# Patient Record
Sex: Female | Born: 1955
Health system: Southern US, Community
[De-identification: ages and names within clinical notes are randomized; demographics above are authoritative.]

## PROBLEM LIST (undated history)

## (undated) HISTORY — PX: AUGMENTATION MAMMAPLASTY: SUR837

---

## 2007-04-17 ENCOUNTER — Encounter: Admission: RE | Admit: 2007-04-17 | Discharge: 2007-04-17 | Payer: Self-pay | Admitting: Obstetrics and Gynecology

## 2007-06-15 ENCOUNTER — Emergency Department (HOSPITAL_COMMUNITY): Admission: EM | Admit: 2007-06-15 | Discharge: 2007-06-15 | Payer: Self-pay | Admitting: Emergency Medicine

## 2008-01-05 ENCOUNTER — Emergency Department (HOSPITAL_BASED_OUTPATIENT_CLINIC_OR_DEPARTMENT_OTHER): Admission: EM | Admit: 2008-01-05 | Discharge: 2008-01-06 | Payer: Self-pay | Admitting: Emergency Medicine

## 2008-01-08 ENCOUNTER — Ambulatory Visit: Payer: Self-pay | Admitting: Internal Medicine

## 2008-01-22 ENCOUNTER — Ambulatory Visit: Payer: Self-pay | Admitting: Internal Medicine

## 2008-04-17 ENCOUNTER — Encounter: Admission: RE | Admit: 2008-04-17 | Discharge: 2008-04-17 | Payer: Self-pay | Admitting: Obstetrics and Gynecology

## 2008-04-30 ENCOUNTER — Encounter: Admission: RE | Admit: 2008-04-30 | Discharge: 2008-04-30 | Payer: Self-pay | Admitting: Obstetrics and Gynecology

## 2009-04-20 ENCOUNTER — Encounter: Admission: RE | Admit: 2009-04-20 | Discharge: 2009-04-20 | Payer: Self-pay | Admitting: Obstetrics and Gynecology

## 2009-05-03 ENCOUNTER — Encounter: Admission: RE | Admit: 2009-05-03 | Discharge: 2009-05-03 | Payer: Self-pay | Admitting: Obstetrics and Gynecology

## 2010-06-18 ENCOUNTER — Other Ambulatory Visit: Payer: Self-pay | Admitting: Obstetrics and Gynecology

## 2010-06-18 DIAGNOSIS — Z1239 Encounter for other screening for malignant neoplasm of breast: Secondary | ICD-10-CM

## 2010-06-19 ENCOUNTER — Encounter: Payer: Self-pay | Admitting: Obstetrics and Gynecology

## 2010-06-20 ENCOUNTER — Encounter: Payer: Self-pay | Admitting: Ophthalmology

## 2010-06-28 NOTE — Miscellaneous (Signed)
Summary: GI PV  Clinical Lists Changes  Medications: Added new medication of MOVIPREP 100 GM  SOLR (PEG-KCL-NACL-NASULF-NA ASC-C) As per prep instructions. - Signed Rx of MOVIPREP 100 GM  SOLR (PEG-KCL-NACL-NASULF-NA ASC-C) As per prep instructions.;  #1 x 0;  Signed;  Entered by: Barton Fanny RN;  Authorized by: Hilarie Fredrickson MD;  Method used: Electronic Observations: Added new observation of NKA: T (01/08/2008 9:27)    Prescriptions: MOVIPREP 100 GM  SOLR (PEG-KCL-NACL-NASULF-NA ASC-C) As per prep instructions.  #1 x 0   Entered by:   Barton Fanny RN   Authorized by:   Hilarie Fredrickson MD   Signed by:   Barton Fanny RN on 01/08/2008   Method used:   Electronically sent to ...       [facility] # [id number]*       [address]       [place], [place]  [address]       Ph: [phone]       Fax: [phone]   RxID:   [id number]

## 2010-06-28 NOTE — Procedures (Signed)
Summary: Colonoscopy   Colonoscopy  Procedure date:  01/22/2008  Findings:      Location:  [facility].    Procedures Next Due Date:    Colonoscopy: 12/2017  Patient Name: Erica, Chambers MRN:  Procedure Procedures: Colonoscopy CPT: 60454.  Personnel: Endoscopist: Wilhemina Bonito. Marina Goodell, MD.  Referred By: Sandra Cockayne, MD.  Exam Location: Exam performed in Outpatient Clinic. Outpatient  Patient Consent: Procedure, Alternatives, Risks and Benefits discussed, consent obtained, from patient. Consent was obtained by the RN.  Indications  Average Risk Screening Routine.  History  Current Medications: Patient is not currently taking Coumadin.  Pre-Exam Physical: Performed Jan 22, 2008. Cardio-pulmonary exam, Rectal exam, HEENT exam , Abdominal exam, Mental status exam WNL.  Comments: Pt. history reviewed/updated, physical exam performed prior to initiation of sedation?yes Exam Exam: Extent of exam reached: Cecum, extent intended: Cecum.  The cecum was identified by appendiceal orifice and IC valve. Patient position: on left side. Time to Cecum: 00:05:28. Time for Withdrawl: 00:12:24. ASA Classification: I. Tolerance: excellent.  Monitoring: Pulse and BP monitoring, Oximetry used. Supplemental O2 given.  Colon Prep Used Movi prep for colon prep. Prep results: excellent.  Sedation Meds: Patient assessed and found to be appropriate for moderate (conscious) sedation. Fentanyl 75 mcg. given IV. Versed 8 mg. given IV. Benadryl 50mg  given IV.  Findings NORMAL EXAM: Cecum to Rectum.   Assessment Normal examination.  Comments: NO POLYPS SEEN Events  Unplanned Interventions: No intervention was required.  Unplanned Events: There were no complications. Plans Disposition: After procedure patient sent to recovery. After recovery patient sent home.  Scheduling/Referral: Colonoscopy, to Wilhemina Bonito. Marina Goodell, MD, IN 10 YEARS FOR ROUTINE SCREENING,     cc.    Wynelle Beckmann Shaw,MD   This report was created from the original endoscopy report, which was reviewed and signed by the above listed endoscopist.

## 2010-07-05 ENCOUNTER — Ambulatory Visit
Admission: RE | Admit: 2010-07-05 | Discharge: 2010-07-05 | Disposition: A | Discharge status: Home / Self Care | Payer: BC Managed Care – PPO | Source: Ambulatory Visit | Attending: Obstetrics and Gynecology | Admitting: Obstetrics and Gynecology

## 2010-07-05 DIAGNOSIS — Z1239 Encounter for other screening for malignant neoplasm of breast: Secondary | ICD-10-CM

## 2010-07-11 ENCOUNTER — Other Ambulatory Visit: Payer: Self-pay | Admitting: Obstetrics and Gynecology

## 2010-07-11 DIAGNOSIS — R928 Other abnormal and inconclusive findings on diagnostic imaging of breast: Secondary | ICD-10-CM

## 2010-07-19 ENCOUNTER — Ambulatory Visit: Payer: BC Managed Care – PPO

## 2010-07-19 ENCOUNTER — Ambulatory Visit
Admission: RE | Admit: 2010-07-19 | Discharge: 2010-07-19 | Disposition: A | Discharge status: Home / Self Care | Payer: BC Managed Care – PPO | Source: Ambulatory Visit | Attending: Obstetrics and Gynecology | Admitting: Obstetrics and Gynecology

## 2010-07-19 DIAGNOSIS — R928 Other abnormal and inconclusive findings on diagnostic imaging of breast: Secondary | ICD-10-CM

## 2011-06-26 ENCOUNTER — Other Ambulatory Visit: Payer: Self-pay | Admitting: Obstetrics and Gynecology

## 2011-06-26 DIAGNOSIS — Z1231 Encounter for screening mammogram for malignant neoplasm of breast: Secondary | ICD-10-CM

## 2011-07-20 ENCOUNTER — Other Ambulatory Visit: Payer: Self-pay | Admitting: Obstetrics and Gynecology

## 2011-07-20 ENCOUNTER — Ambulatory Visit
Admission: RE | Admit: 2011-07-20 | Discharge: 2011-07-20 | Disposition: A | Discharge status: Home / Self Care | Payer: 59 | Source: Ambulatory Visit | Attending: Obstetrics and Gynecology | Admitting: Obstetrics and Gynecology

## 2011-07-20 DIAGNOSIS — Z1231 Encounter for screening mammogram for malignant neoplasm of breast: Secondary | ICD-10-CM

## 2012-07-31 ENCOUNTER — Other Ambulatory Visit: Payer: Self-pay

## 2012-07-31 DIAGNOSIS — Z1231 Encounter for screening mammogram for malignant neoplasm of breast: Secondary | ICD-10-CM

## 2012-08-22 ENCOUNTER — Ambulatory Visit
Admission: RE | Admit: 2012-08-22 | Discharge: 2012-08-22 | Disposition: A | Discharge status: Home / Self Care | Payer: BC Managed Care – PPO | Source: Ambulatory Visit

## 2012-08-22 DIAGNOSIS — Z1231 Encounter for screening mammogram for malignant neoplasm of breast: Secondary | ICD-10-CM

## 2013-08-06 ENCOUNTER — Other Ambulatory Visit: Payer: Self-pay

## 2013-08-06 DIAGNOSIS — Z1231 Encounter for screening mammogram for malignant neoplasm of breast: Secondary | ICD-10-CM

## 2013-09-02 ENCOUNTER — Ambulatory Visit
Admission: RE | Admit: 2013-09-02 | Discharge: 2013-09-02 | Disposition: A | Discharge status: Home / Self Care | Payer: BC Managed Care – PPO | Source: Ambulatory Visit

## 2013-09-02 DIAGNOSIS — Z1231 Encounter for screening mammogram for malignant neoplasm of breast: Secondary | ICD-10-CM

## 2013-10-28 DIAGNOSIS — H16211 Exposure keratoconjunctivitis, right eye: Secondary | ICD-10-CM | POA: Insufficient documentation

## 2014-10-20 ENCOUNTER — Encounter: Payer: Self-pay | Admitting: Internal Medicine

## 2015-01-07 ENCOUNTER — Other Ambulatory Visit: Payer: Self-pay

## 2015-01-07 DIAGNOSIS — Z1231 Encounter for screening mammogram for malignant neoplasm of breast: Secondary | ICD-10-CM

## 2015-01-22 ENCOUNTER — Ambulatory Visit
Admission: RE | Admit: 2015-01-22 | Discharge: 2015-01-22 | Disposition: A | Discharge status: Home / Self Care | Payer: BLUE CROSS/BLUE SHIELD | Source: Ambulatory Visit

## 2015-01-22 DIAGNOSIS — Z1231 Encounter for screening mammogram for malignant neoplasm of breast: Secondary | ICD-10-CM

## 2015-04-30 ENCOUNTER — Other Ambulatory Visit: Payer: Self-pay | Admitting: Otolaryngology

## 2015-04-30 DIAGNOSIS — H9191 Unspecified hearing loss, right ear: Secondary | ICD-10-CM

## 2015-05-09 ENCOUNTER — Other Ambulatory Visit: Payer: BLUE CROSS/BLUE SHIELD

## 2015-05-18 ENCOUNTER — Other Ambulatory Visit: Payer: Self-pay | Admitting: Obstetrics and Gynecology

## 2015-05-18 DIAGNOSIS — N3289 Other specified disorders of bladder: Secondary | ICD-10-CM

## 2015-05-26 ENCOUNTER — Other Ambulatory Visit: Payer: BLUE CROSS/BLUE SHIELD

## 2015-06-03 ENCOUNTER — Ambulatory Visit
Admission: RE | Admit: 2015-06-03 | Discharge: 2015-06-03 | Disposition: A | Discharge status: Home / Self Care | Payer: BLUE CROSS/BLUE SHIELD | Source: Ambulatory Visit | Attending: Obstetrics and Gynecology | Admitting: Obstetrics and Gynecology

## 2015-06-03 DIAGNOSIS — N3289 Other specified disorders of bladder: Secondary | ICD-10-CM

## 2015-06-03 MED ORDER — IOPAMIDOL (ISOVUE-300) INJECTION 61%
100.0000 mL | Freq: Once | INTRAVENOUS | Status: AC | PRN
  Administered 2015-06-03: 100 mL via INTRAVENOUS

## 2016-01-06 ENCOUNTER — Other Ambulatory Visit: Payer: Self-pay | Admitting: Obstetrics and Gynecology

## 2016-01-06 DIAGNOSIS — Z9882 Breast implant status: Secondary | ICD-10-CM

## 2016-01-06 DIAGNOSIS — Z1231 Encounter for screening mammogram for malignant neoplasm of breast: Secondary | ICD-10-CM

## 2016-01-27 ENCOUNTER — Ambulatory Visit
Admission: RE | Admit: 2016-01-27 | Discharge: 2016-01-27 | Disposition: A | Discharge status: Home / Self Care | Payer: BLUE CROSS/BLUE SHIELD | Source: Ambulatory Visit | Attending: Obstetrics and Gynecology | Admitting: Obstetrics and Gynecology

## 2016-01-27 DIAGNOSIS — Z9882 Breast implant status: Secondary | ICD-10-CM

## 2016-01-27 DIAGNOSIS — Z1231 Encounter for screening mammogram for malignant neoplasm of breast: Secondary | ICD-10-CM

## 2016-10-25 IMAGING — CT CT ABD-PELV W/ CM
2 of 5 series · 10 of 36 positions shown, 17 images · IV contrast (READICAT/WATER & [ID] ISOVUE 300)
Comparison: None.

CLINICAL DATA: Possible bladder mass on physical exam. Prior
rectocele repair in 6552, history of partial hysterectomy in 0551.

EXAM:
CT ABDOMEN AND PELVIS WITH CONTRAST
TECHNIQUE: Multidetector CT imaging of the abdomen and pelvis was performed
using the standard protocol following bolus administration of
intravenous contrast.
CONTRAST:  100mL RTFO2K-0RR IOPAMIDOL (RTFO2K-0RR) INJECTION 61%

[Series 3: abd/pelvis with · axial · 0.59mm/px · z∈[-348,-18]mm · 9 of 84 slices shown, 15 images]
[im 9/84  soft-tissue]
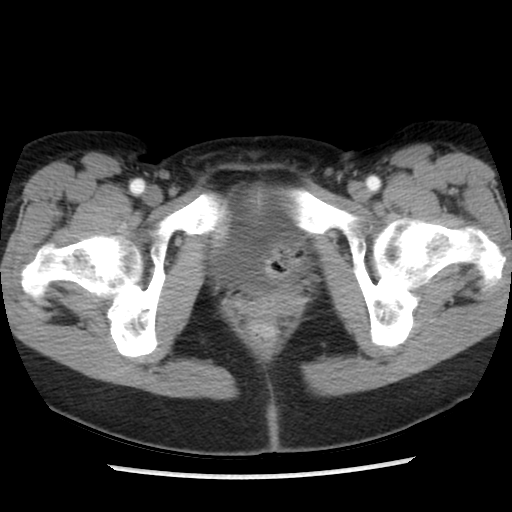
[im 9/84  bone]
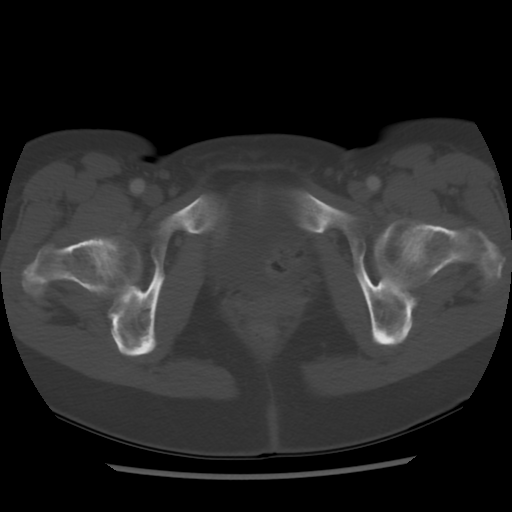
[im 17/84  soft-tissue]
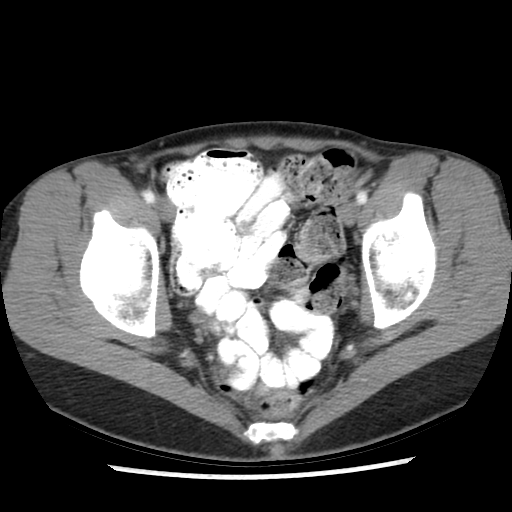
[im 25/84  soft-tissue]
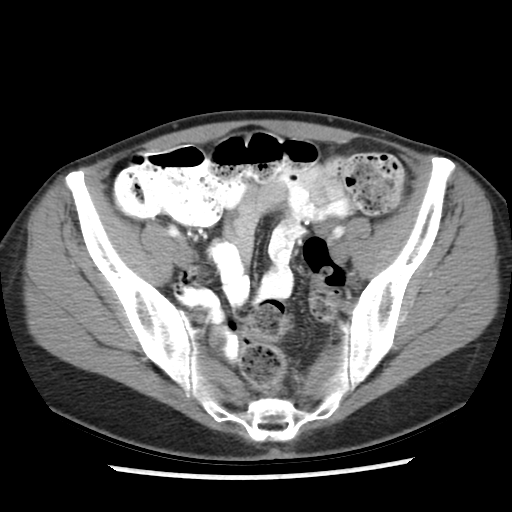
[im 34/84  soft-tissue]
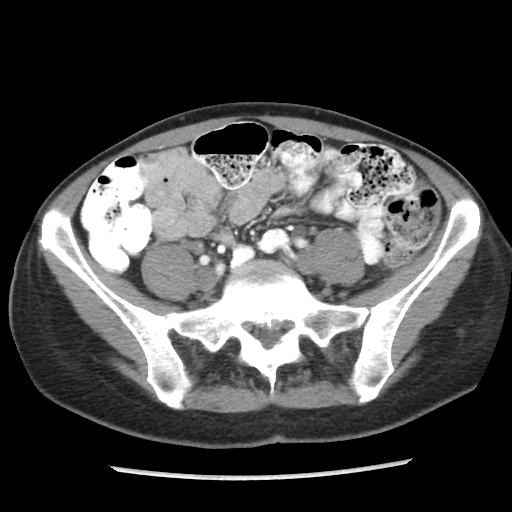
[im 42/84  soft-tissue]
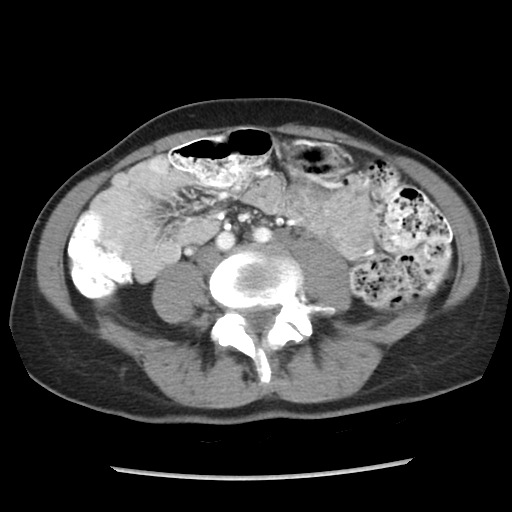
[im 50/84  soft-tissue]
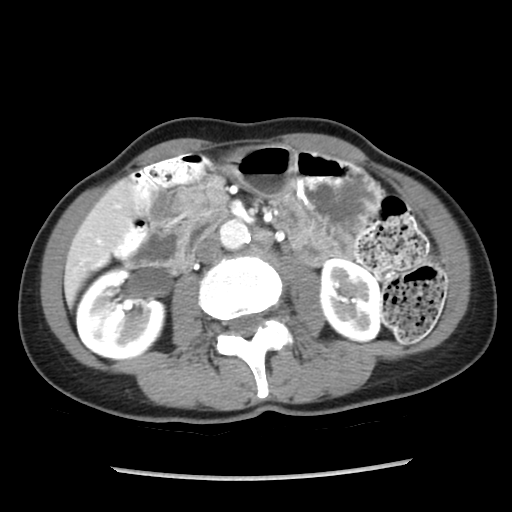
[im 50/84  lung]
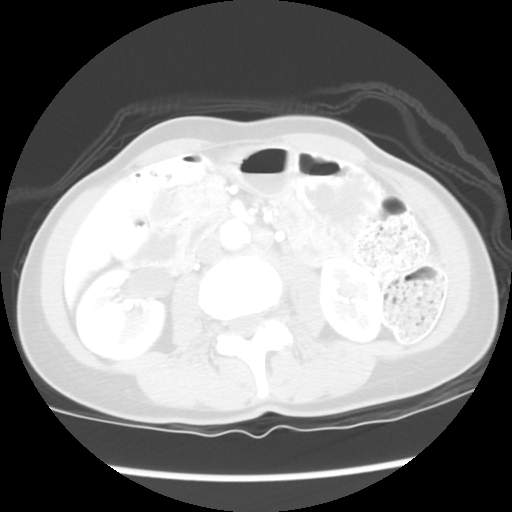
[im 59/84  soft-tissue]
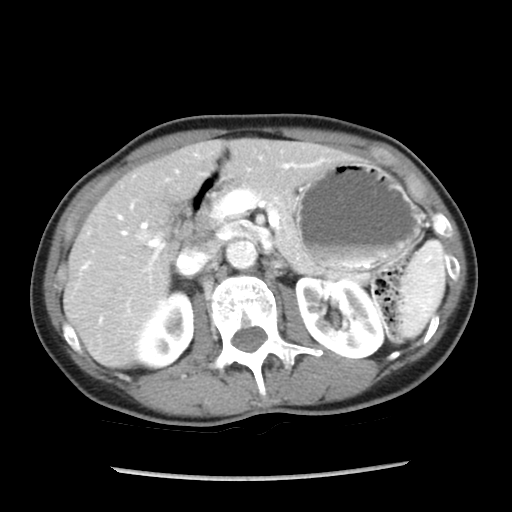
[im 59/84  lung]
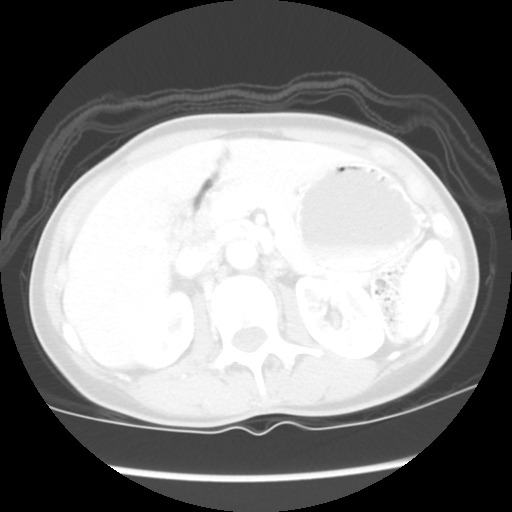
[im 67/84  soft-tissue]
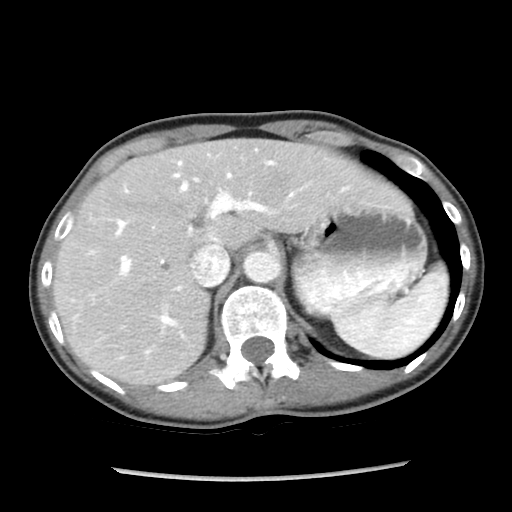
[im 67/84  lung]
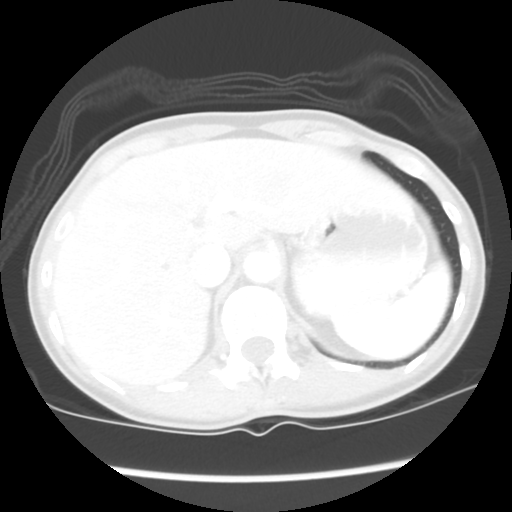
[im 75/84  soft-tissue]
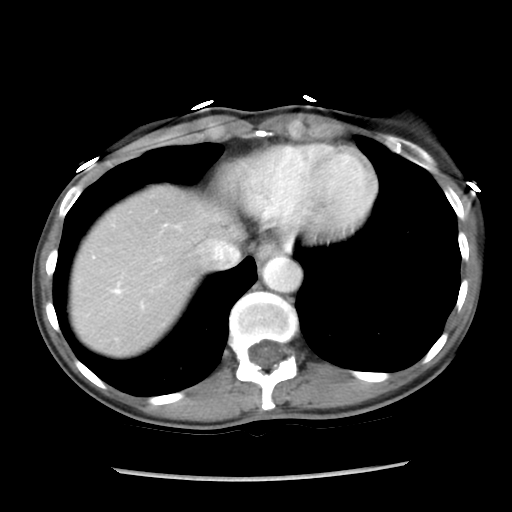
[im 75/84  lung]
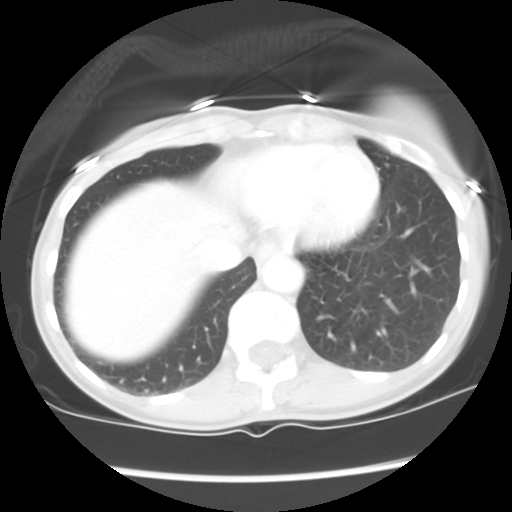
[im 75/84  bone]
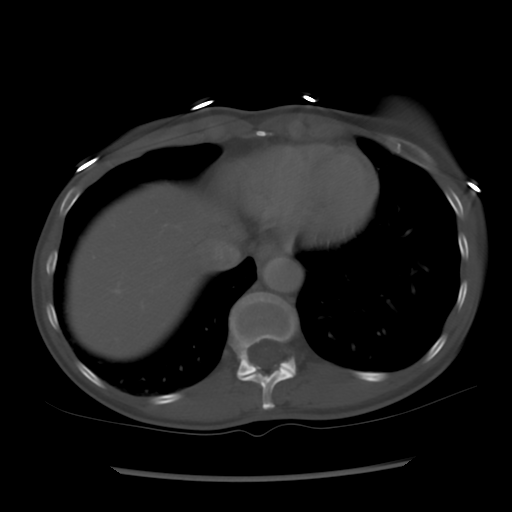

[Series 601: coronal body · coronal · 0.90mm/px · 1 of 103 slices shown, 2 images]
[im 35/103  soft-tissue]
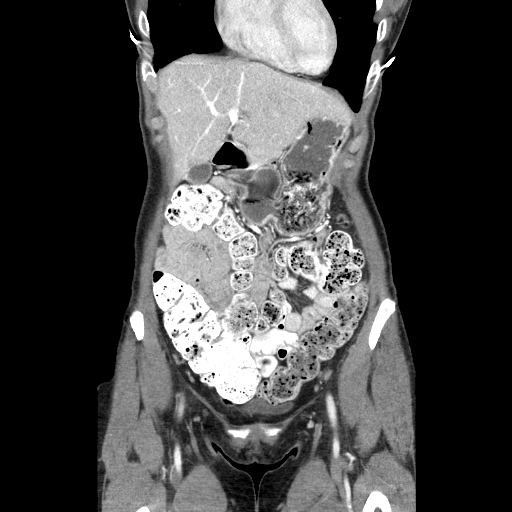
[im 35/103  bone]
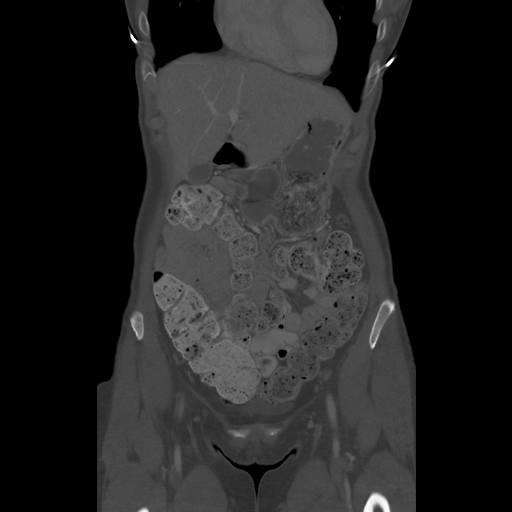

[10 of 36 positions shown; findings below may reference images not displayed]

FINDINGS: Lower chest:  Lung bases are clear.

Bilateral breast augmentation, incompletely visualized.

Hepatobiliary: Liver is within normal limits. No
suspicious/enhancing hepatic lesions.

Gallbladder is unremarkable. No intrahepatic or extrahepatic ductal
dilatation.

Pancreas: Within normal limits.

Spleen: Within normal limits.

Adrenals/Urinary Tract: Adrenal glands are within normal limits.

1.5 cm right lower pole renal cyst (series 5/ image 24), benign
(Bosniak I). Left kidney is within normal limits. No hydronephrosis.

Bladder is within normal limits.

Stomach/Bowel: Stomach is within normal limits.

No evidence of bowel obstruction.

Moderate left colonic stool burden.

Vascular/Lymphatic: No evidence of abdominal aortic aneurysm.

No suspicious abdominopelvic lymphadenopathy.

Reproductive: Status post hysterectomy. Residual soft tissue in the
left pelvis (series 3/ image 31) is likely cervical tissue in the
setting of supracervical hysterectomy.

Bilateral ovaries are within normal limits.

Other: No abdominopelvic ascites.

Musculoskeletal: Mild degenerative changes of the lower lumbar
spine.
IMPRESSION: Bladder is within normal limits on CT.

1.5 cm right lower pole renal cyst, benign (Bosniak I).

Suspected prior supracervical hysterectomy.

## 2017-01-15 ENCOUNTER — Other Ambulatory Visit: Payer: Self-pay | Admitting: Obstetrics and Gynecology

## 2017-01-15 DIAGNOSIS — Z1231 Encounter for screening mammogram for malignant neoplasm of breast: Secondary | ICD-10-CM

## 2017-02-02 ENCOUNTER — Ambulatory Visit
Admission: RE | Admit: 2017-02-02 | Discharge: 2017-02-02 | Disposition: A | Discharge status: Home / Self Care | Payer: BLUE CROSS/BLUE SHIELD | Source: Ambulatory Visit | Attending: Obstetrics and Gynecology | Admitting: Obstetrics and Gynecology

## 2017-02-02 DIAGNOSIS — Z1231 Encounter for screening mammogram for malignant neoplasm of breast: Secondary | ICD-10-CM

## 2017-02-06 ENCOUNTER — Other Ambulatory Visit: Payer: Self-pay | Admitting: Obstetrics and Gynecology

## 2017-02-06 DIAGNOSIS — R928 Other abnormal and inconclusive findings on diagnostic imaging of breast: Secondary | ICD-10-CM

## 2017-02-15 ENCOUNTER — Ambulatory Visit
Admission: RE | Admit: 2017-02-15 | Discharge: 2017-02-15 | Disposition: A | Discharge status: Home / Self Care | Payer: BLUE CROSS/BLUE SHIELD | Source: Ambulatory Visit | Attending: Obstetrics and Gynecology | Admitting: Obstetrics and Gynecology

## 2017-02-15 DIAGNOSIS — R928 Other abnormal and inconclusive findings on diagnostic imaging of breast: Secondary | ICD-10-CM

## 2017-03-02 ENCOUNTER — Other Ambulatory Visit: Payer: Self-pay | Admitting: General Surgery

## 2017-03-02 ENCOUNTER — Ambulatory Visit: Payer: Self-pay | Admitting: General Surgery

## 2017-03-02 DIAGNOSIS — R921 Mammographic calcification found on diagnostic imaging of breast: Secondary | ICD-10-CM

## 2017-03-13 DIAGNOSIS — R928 Other abnormal and inconclusive findings on diagnostic imaging of breast: Secondary | ICD-10-CM | POA: Insufficient documentation

## 2017-03-29 ENCOUNTER — Ambulatory Visit (HOSPITAL_BASED_OUTPATIENT_CLINIC_OR_DEPARTMENT_OTHER): Admit: 2017-03-29 | Payer: BLUE CROSS/BLUE SHIELD | Admitting: General Surgery

## 2017-03-29 ENCOUNTER — Encounter (HOSPITAL_BASED_OUTPATIENT_CLINIC_OR_DEPARTMENT_OTHER): Payer: Self-pay

## 2017-03-29 SURGERY — RADIOACTIVE SEED GUIDED BREAST BIOPSY
Anesthesia: General | Site: Breast | Laterality: Left

## 2017-04-16 DIAGNOSIS — D0512 Intraductal carcinoma in situ of left breast: Secondary | ICD-10-CM | POA: Insufficient documentation

## 2017-06-21 DIAGNOSIS — B002 Herpesviral gingivostomatitis and pharyngotonsillitis: Secondary | ICD-10-CM | POA: Insufficient documentation

## 2018-01-03 ENCOUNTER — Encounter: Payer: Self-pay | Admitting: Internal Medicine

## 2018-01-09 DIAGNOSIS — Z9889 Other specified postprocedural states: Secondary | ICD-10-CM | POA: Insufficient documentation

## 2018-01-09 DIAGNOSIS — Z9882 Breast implant status: Secondary | ICD-10-CM

## 2018-01-10 ENCOUNTER — Encounter: Payer: Self-pay | Admitting: Internal Medicine

## 2018-01-30 DIAGNOSIS — H90A21 Sensorineural hearing loss, unilateral, right ear, with restricted hearing on the contralateral side: Secondary | ICD-10-CM | POA: Insufficient documentation

## 2018-01-30 DIAGNOSIS — G510 Bell's palsy: Secondary | ICD-10-CM | POA: Insufficient documentation

## 2018-02-25 ENCOUNTER — Encounter: Payer: BLUE CROSS/BLUE SHIELD | Admitting: Internal Medicine

## 2018-02-28 ENCOUNTER — Encounter: Payer: BLUE CROSS/BLUE SHIELD | Admitting: Internal Medicine

## 2018-03-07 ENCOUNTER — Encounter: Payer: BLUE CROSS/BLUE SHIELD | Admitting: Internal Medicine

## 2018-03-22 DIAGNOSIS — D1800 Hemangioma unspecified site: Secondary | ICD-10-CM | POA: Insufficient documentation

## 2018-03-27 DIAGNOSIS — H02231 Paralytic lagophthalmos right upper eyelid: Secondary | ICD-10-CM | POA: Insufficient documentation

## 2018-05-08 DIAGNOSIS — H18891 Other specified disorders of cornea, right eye: Secondary | ICD-10-CM | POA: Insufficient documentation

## 2018-07-01 DIAGNOSIS — N393 Stress incontinence (female) (male): Secondary | ICD-10-CM | POA: Insufficient documentation

## 2018-07-01 DIAGNOSIS — N951 Menopausal and female climacteric states: Secondary | ICD-10-CM | POA: Insufficient documentation

## 2018-07-01 DIAGNOSIS — R6882 Decreased libido: Secondary | ICD-10-CM | POA: Insufficient documentation

## 2018-07-20 ENCOUNTER — Encounter: Payer: Self-pay | Admitting: Podiatry

## 2018-07-20 ENCOUNTER — Ambulatory Visit: Payer: BLUE CROSS/BLUE SHIELD | Admitting: Podiatry

## 2018-07-20 ENCOUNTER — Ambulatory Visit (INDEPENDENT_AMBULATORY_CARE_PROVIDER_SITE_OTHER): Payer: BLUE CROSS/BLUE SHIELD

## 2018-07-20 DIAGNOSIS — S90112A Contusion of left great toe without damage to nail, initial encounter: Secondary | ICD-10-CM

## 2018-07-20 DIAGNOSIS — M7752 Other enthesopathy of left foot: Secondary | ICD-10-CM

## 2018-07-20 NOTE — Progress Notes (Signed)
This patient calls to the office this morning stating that she injured her big toe left foot.  She says that she is having her daughter married in 10 days and she was dancing at a [employer]  and her husband stepped on her toe.  She went home and soaked her foot and toe in Epson salts.  She is presenting to the office concerned that there may be a true injury to her left big toe.  She presents the office today for an evaluation and treatment of this left great toe.  She presents the office today with her husband for my evaluation  Vascular  Dorsalis pedis and posterior tibial pulses are palpable  B/L.  Capillary return  WNL.  Temperature gradient is  WNL.  Skin turgor  WNL  Sensorium  Senn Weinstein monofilament wire  WNL. Normal tactile sensation.  Nail Exam  Patient has normal nails with no evidence of bacterial or fungal infection. No injury to the medial aspect left hallux.  Orthopedic  Exam  Muscle tone and muscle strength  WNL.  No limitations of motion feet  B/L.  No crepitus or joint effusion noted.  HAV and tailors  Bunion  B/L.  Skin  No open lesions.  Normal skin texture and turgor. No evidence of redness or ecchymosis along the medial aspect nail border left hallux.  Bone contusion    Initial exam was performed.  X-rays were taken revealed no evidence of any bony pathology.  Examination of her toe reveals no evidence of any soft tissue injury following her accidental injury.  She was told to continue soaks and to wear shoes that put minimal pressure at the site of the injury.  I told her that a bone contusion will just get better over time.  Return to the clinic as needed.   Gardiner Barefoot DPM

## 2018-07-22 ENCOUNTER — Ambulatory Visit: Payer: BLUE CROSS/BLUE SHIELD | Admitting: Podiatry

## 2019-09-02 ENCOUNTER — Ambulatory Visit: Payer: Self-pay | Attending: Internal Medicine

## 2019-09-02 DIAGNOSIS — Z23 Encounter for immunization: Secondary | ICD-10-CM

## 2019-09-02 NOTE — Progress Notes (Signed)
   Covid-19 Vaccination Clinic  Name:  Erica Chambers    MRN: [id number] DOB: 1956/02/05  09/02/2019  [name] was observed post Covid-19 immunization for 15 minutes without incident. She was provided with Vaccine Information Sheet and instruction to access the V-Safe system.   [name] was instructed to call 911 with any severe reactions post vaccine: Marland Kitchen Difficulty breathing  . Swelling of face and throat  . A fast heartbeat  . A bad rash all over body  . Dizziness and weakness   Immunizations Administered    Name Date Dose VIS Date Route   Pfizer COVID-19 Vaccine 09/02/2019  3:12 PM 0.3 mL 05/09/2019 Intramuscular   Manufacturer: [employer]   Lot: [id number]   [place]: [id number]

## 2019-09-30 ENCOUNTER — Other Ambulatory Visit: Payer: Self-pay

## 2019-09-30 ENCOUNTER — Ambulatory Visit: Payer: BLUE CROSS/BLUE SHIELD | Attending: Radiology

## 2019-09-30 DIAGNOSIS — R471 Dysarthria and anarthria: Secondary | ICD-10-CM | POA: Diagnosis present

## 2019-09-30 DIAGNOSIS — R1311 Dysphagia, oral phase: Secondary | ICD-10-CM | POA: Insufficient documentation

## 2019-09-30 NOTE — Patient Instructions (Addendum)
   LIP exercises  --- DO ALL EXERCISES TWICE EACH DAY  1. Lip press - Press your lips together firmly (like making a /m/ sound) and hold 5 seconds -repeat 15 times  2. LOUD and LONG William Dalton - make a kissing sound as loud as you can, as long as you can - repeat 15 times  3. Say "OOOOO", and "EEEEE" 15 times (Kiss and smile)  4. Put air in your cheeks, and push on either side 10 times  *KEEP THE AIR IN and DON'T LET IT ESCAPE!!*

## 2019-09-30 NOTE — Therapy (Signed)
[facility] [address] [place], [place], [address] Phone: [phone]   Fax:  [phone]  Speech Language Pathology Evaluation  Patient Details  Name: Erica Chambers MRN: [id number] Date of Birth: 05/17/1956 Referring Provider (SLP): Morrison Old, MD   Encounter Date: 09/30/2019  End of Session - 09/30/19 2256    Visit Number  1    Number of Visits  13    Date for SLP Re-Evaluation  12/09/19    SLP Start Time  [id number]    SLP Stop Time   1102    SLP Time Calculation (min)  41 min    Activity Tolerance  Patient tolerated treatment well       History reviewed. No pertinent past medical history.  Past Surgical History:  Procedure Laterality Date  . AUGMENTATION MAMMAPLASTY Bilateral     There were no vitals filed for this visit.  Subjective Assessment - 09/30/19 2249    Subjective  ""In September it just went flat again." (pt, re: rt side of her face) "I don't dribble but it's easier to drink with a straw on the lt side."    Currently in Pain?  No/denies         SLP Evaluation [facility] - 09/30/19 1000      SLP Visit Information   SLP Received On  09/30/19    Referring Provider (SLP)  Morrison Old, MD    Onset Date  September 2020    Medical Diagnosis  CVA ("leakage" near cavernoma)      General Information   HPI  Pt with gamma knife procedure in September 2019 to cavernous angioma in rt lteral pons/middle cerebellar peduncle, with resulting rt facial weakness. Pt underwent static suspension surgery ("facial sling" surgery) in November 2019 to correct facial droop. In September 2020, pt had notble gradual rt facial paralysis and one day pt noted /b/, /p/ were more challenging to articulate. Pt undergoing chemo at the time and medical oncologist ordered MRI which revelaled hemmorhagic event in the malformation.       Prior Functional Status   Cognitive/Linguistic Baseline  Within functional limits    Type of  Home  House     Lives With  Spouse    Available Support  Family      Cognition   Overall Cognitive Status  Within Functional Limits for tasks assessed      Auditory Comprehension   Overall Auditory Comprehension  Appears within functional limits for tasks assessed      Verbal Expression   Overall Verbal Expression  Appears within functional limits for tasks assessed      Oral Motor/Sensory Function   Overall Oral Motor/Sensory Function  Impaired    Labial ROM  Reduced right    Labial Symmetry  Abnormal symmetry right    Labial Strength  Reduced Right    Labial Sensation  Reduced Right    Labial Coordination  Reduced   rt   Lingual ROM  Within Functional Limits    Lingual Symmetry  Other (Comment)   deviates slightly left   Lingual Strength  Within Functional Limits    Lingual Coordination  WFL    Facial ROM  Reduced right    Facial Symmetry  Right droop    Facial Strength  --   rt reduced     Motor Speech   Overall Motor Speech  Impaired    Phonation  Normal    Articulation  Impaired    Level  of Impairment  Phrase    Intelligibility  Intelligible    Effective Techniques  Increased vocal intensity;Slow rate;Over-articulate    Phonation  WFL   loudness is WNL                     SLP Education - 09/30/19 2255    Education Details  HEP procedure, therapy goals, need to do HEP for at least 6 weeks, parameters around HEP not working    [employer]) Educated  Patient    Methods  Explanation;Demonstration;Handout    Comprehension  Verbalized understanding;Returned demonstration;Verbal cues required;Need further instruction       SLP Short Term Goals - 09/30/19 2301      SLP SHORT TERM GOAL #1   Title  pt will perform HEP with rare min A for 2 sessions    Time  3    Period  Weeks    Status  New      SLP SHORT TERM GOAL #2   Title  pt will maintain 100% intelligibility over 3 sessions    Time  3    Period  Weeks    Status  New       SLP Long Term  Goals - 09/30/19 2302      SLP LONG TERM GOAL #1   Title  pt will maintain 100% intelligibility over 6 total sessions    Time  6    Period  Weeks   or 13 visits   Status  New      SLP LONG TERM GOAL #2   Title  pt will demo HEP for dysarthria with rare min A in 3 sessions    Time  6    Period  Weeks    Status  New       Plan - 09/30/19 2257    Clinical Impression Statement  Pt presents today with significant rt sided facial droop including minimal/no movement of facial/labial musculature on rt side, following a "leak"/bleed at the side of cavernous angioma in rt lateral pons/middle cerebellar peduncle since September 2020, resulting in slurred speech and oral dysphagia. Pt states she is skeptical she will regain function of rt side of her face, even though her MD states she will over time. Pt would benefit from skilled ST focusing on muscle strengthening for lips and facial musculature on rt.    Speech Therapy Frequency  2x / week    Duration  --   6 weeks or 13 total visits   Treatment/Interventions  Oral motor exercises;Functional tasks;Cueing hierarchy;SLP instruction and feedback;Patient/family education;Compensatory strategies;Internal/external aids    Potential to Achieve Goals  Fair    Potential Considerations  Previous level of function    SLP Home Exercise Plan  provided today    Consulted and Agree with Plan of Care  Patient       Patient will benefit from skilled therapeutic intervention in order to improve the following deficits and impairments:   Dysarthria and anarthria  Dysphagia, oral phase    Problem List Patient Active Problem List   Diagnosis Date Noted  . Female stress incontinence 07/01/2018  . Menopausal symptom 07/01/2018  . Reduced libido 07/01/2018  . Corneal irritation of right eye 05/08/2018  . Paralytic lagophthalmos of right upper eyelid 03/27/2018  . Cavernoma 03/22/2018  . Facial paralysis 01/30/2018  . Sensorineural hearing loss (SNHL) of  right ear with restricted hearing of left ear 01/30/2018  . History of breast reconstruction 01/09/2018  . Oral  herpes simplex infection 06/21/2017  . Breast neoplasm, Tis (DCIS), left 04/16/2017  . Abnormal mammogram of left breast 03/13/2017  . Exposure keratopathy, right 10/28/2013    [facility] 09/30/2019, 11:07 PM  [facility] [address] [place], [place], [address] Phone: [phone]   Fax:  [phone]  Name: Erica Chambers MRN: [id number] Date of Birth: September 11, 1955

## 2019-10-03 ENCOUNTER — Other Ambulatory Visit: Payer: Self-pay

## 2019-10-03 ENCOUNTER — Ambulatory Visit: Payer: BLUE CROSS/BLUE SHIELD

## 2019-10-03 DIAGNOSIS — R471 Dysarthria and anarthria: Secondary | ICD-10-CM

## 2019-10-03 DIAGNOSIS — R1311 Dysphagia, oral phase: Secondary | ICD-10-CM

## 2019-10-03 NOTE — Therapy (Signed)
[facility] [address] [place], [place], [address] Phone: [phone]   Fax:  [phone]  Speech Language Pathology Treatment  Patient Details  Name: Erica Chambers MRN: [id number] Date of Birth: November 14, 1955 Referring Provider (SLP): Morrison Old, MD   Encounter Date: 10/03/2019  End of Session - 10/03/19 1149    Visit Number  2    Number of Visits  13    Date for SLP Re-Evaluation  12/09/19    SLP Start Time  [id number]    SLP Stop Time   1145    SLP Time Calculation (min)  40 min    Activity Tolerance  Patient tolerated treatment well       History reviewed. No pertinent past medical history.  Past Surgical History:  Procedure Laterality Date  . AUGMENTATION MAMMAPLASTY Bilateral     There were no vitals filed for this visit.  Subjective Assessment - 10/03/19 1108    Currently in Pain?  No/denies            ADULT SLP TREATMENT - 10/03/19 1108      General Information   Behavior/Cognition  Alert;Cooperative;Pleasant mood      Treatment Provided   Treatment provided  Cognitive-Linquistic      Cognitive-Linquistic Treatment   Treatment focused on  Dysarthria    Skilled Treatment  "It's gone pretty well- they don't take long." Pt reports the labial pucker/kiss is difficult but pt now getting 3 good reps instead of ust 1 good rep. In demonstration today pt produced variably accurate productions with 7 productions. She reports air from one cheek to the next results in her LEFT cheek staying still and her RIGHT cheek moving - SLP thought it would be the opposite however when pt modeled exercise for SLP she STARTED with the air on the weak side so the strong side was not being inflated (reason for no movement on lt side). With kissing exercise, pt compensting sometimes with a like sound produced with inferior aspect of the bottom lip and her teeth - pt aware of this so SLP just reinforced kisisng sound has to be  with the very end of the lips. SLP educated pt on more labial exercises today and provided guidance on pt production of each new exericise. Pt modified indpendent at session's end.       Assessment / Recommendations / Plan   Plan  Continue with current plan of care      Progression Toward Goals   Progression toward goals  Progressing toward goals       SLP Education - 10/03/19 1149    Education Details  HEP procedure on new exercises, need to complete HEP for 6-8 weeks to see any benefit or not    Person(s) Educated  Patient    Methods  Explanation;Demonstration;Verbal cues;Handout    Comprehension  Verbalized understanding;Returned demonstration;Verbal cues required;Need further instruction       SLP Short Term Goals - 10/03/19 1151      SLP SHORT TERM GOAL #1   Title  pt will perform HEP with rare min A for 2 sessions    Time  3    Period  Weeks    Status  On-going      SLP SHORT TERM GOAL #2   Title  pt will maintain 100% intelligibility over 3 sessions    Baseline  10-03-19    Time  3    Period  Weeks    Status  On-going       SLP Long Term Goals - 10/03/19 1151      SLP LONG TERM GOAL #1   Title  pt will maintain 100% intelligibility over 6 total sessions    Baseline  10-03-19    Time  6    Period  Weeks   or 13 visits   Status  On-going      SLP LONG TERM GOAL #2   Title  pt will demo HEP for dysarthria with rare min A in 3 sessions    Time  6    Period  Weeks    Status  On-going       Plan - 10/03/19 1150    Clinical Impression Statement  Pt continues today with significant rt sided facial droop including minimal/no movement of facial/labial musculature on rt side, following a "leak"/bleed at the side of cavernous angioma in rt lateral pons/middle cerebellar peduncle, since September 2020, resulting in slurred speech and oral dysphagia. Pt states she is skeptical she will regain function of rt side of her face, even though her MD states she will over time. SLP  provided more exercises for pt and she was modified independnent with them when she left ST room today. Pt would cont to benefit from skilled ST focusing on muscle strengthening for lips and facial musculature on rt.    Speech Therapy Frequency  2x / week    Duration  --   6 weeks or 13 total visits   Treatment/Interventions  Oral motor exercises;Functional tasks;Cueing hierarchy;SLP instruction and feedback;Patient/family education;Compensatory strategies;Internal/external aids    Potential to Achieve Goals  Fair    Potential Considerations  Previous level of function    SLP Home Exercise Plan  provided today    Consulted and Agree with Plan of Care  Patient       Patient will benefit from skilled therapeutic intervention in order to improve the following deficits and impairments:   Dysarthria and anarthria  Dysphagia, oral phase    Problem List Patient Active Problem List   Diagnosis Date Noted  . Female stress incontinence 07/01/2018  . Menopausal symptom 07/01/2018  . Reduced libido 07/01/2018  . Corneal irritation of right eye 05/08/2018  . Paralytic lagophthalmos of right upper eyelid 03/27/2018  . Cavernoma 03/22/2018  . Facial paralysis 01/30/2018  . Sensorineural hearing loss (SNHL) of right ear with restricted hearing of left ear 01/30/2018  . History of breast reconstruction 01/09/2018  . Oral herpes simplex infection 06/21/2017  . Breast neoplasm, Tis (DCIS), left 04/16/2017  . Abnormal mammogram of left breast 03/13/2017  . Exposure keratopathy, right 10/28/2013    [facility] ,MS, CCC-SLP  10/03/2019, 11:52 AM  [facility] [address] [place], [place], [address] Phone: [phone]   Fax:  [phone]   Name: Erica Chambers MRN: [id number] Date of Birth: [birth year]

## 2019-10-03 NOTE — Patient Instructions (Addendum)
  Do these twice each day  1. Press hard, and briefly hold, all the beginning sounds in these words, then repeat.     "ma" x3 "boo" x3 "pa"x3          "moo"  "bye"  "pie"          "me"  "bee"  "pea"     "muh"  "buh"  "puh"    "may"  "bay"  "pay"  2. Practice whistling for 20 seconds, 3 times.  3 "Blow out" candles.  Repeat 15  times.  4. Blow kisses - make them LOUD!  Repeat 15 times.   PHRASES TO PRACTICE: say two or three times each, x2/day. FOCUS ON  S T R O N G p's, b's, m's  Plump plumbers' plums  Black bugs blood  Big grocery buggy     Purple baby carriage  [facility]  Proper copper coffee pot  Ripe purple cabbage  Dye the pets purple

## 2019-10-08 ENCOUNTER — Other Ambulatory Visit: Payer: Self-pay

## 2019-10-08 ENCOUNTER — Ambulatory Visit: Payer: BLUE CROSS/BLUE SHIELD

## 2019-10-08 DIAGNOSIS — R471 Dysarthria and anarthria: Secondary | ICD-10-CM | POA: Diagnosis not present

## 2019-10-08 DIAGNOSIS — R1311 Dysphagia, oral phase: Secondary | ICD-10-CM

## 2019-10-08 NOTE — Therapy (Signed)
[facility] [address] [place], [place], [address] Phone: [phone]   Fax:  [phone]  Speech Language Pathology Treatment  Patient Details  Name: Erica Chambers MRN: [id number] Date of Birth: 06-Apr-1956 Referring Provider (SLP): Morrison Old, MD   Encounter Date: 10/08/2019  End of Session - 10/08/19 1405    Visit Number  3    Number of Visits  13    Date for SLP Re-Evaluation  12/09/19    SLP Start Time  35    SLP Stop Time   1348    SLP Time Calculation (min)  29 min    Activity Tolerance  Patient tolerated treatment well       No past medical history on file.  Past Surgical History:  Procedure Laterality Date  . AUGMENTATION MAMMAPLASTY Bilateral     There were no vitals filed for this visit.  Subjective Assessment - 10/08/19 1324    Subjective  "They (exercises) are going."    Currently in Pain?  No/denies            ADULT SLP TREATMENT - 10/08/19 1325      General Information   Behavior/Cognition  Alert;Cooperative;Pleasant mood      Treatment Provided   Treatment provided  Cognitive-Linquistic      Cognitive-Linquistic Treatment   Treatment focused on  Dysarthria    Skilled Treatment  "I have the first page (of the exercises) memorized." Pt performed her HEP with SLP assessing proper completion - pt req'd SBA but completion was adequate/approprite for reduction to x1/week.  SLP made sure to tell pt that if there is a portion of her flaccidity due to decr'd strength, ont to expect any improvement for at LEAST 4 weeks.       Assessment / Recommendations / Plan   Plan  Continue with current plan of care      Progression Toward Goals   Progression toward goals  Progressing toward goals       SLP Education - 10/08/19 1405    Education Details  reiterated to pt that no progress expected in at LEAST 4 weeks    Person(s) Educated  Patient    Methods  Explanation    Comprehension   Verbalized understanding       SLP Short Term Goals - 10/08/19 1409      SLP SHORT TERM GOAL #1   Title  pt will perform HEP with rare min A for 2 sessions    Baseline  10-08-19    Time  2    Period  Weeks    Status  On-going      SLP SHORT TERM GOAL #2   Title  pt will maintain 100% intelligibility over 3 sessions    Baseline  10-03-19, 10-08-19    Time  2    Period  Weeks    Status  On-going       SLP Long Term Goals - 10/08/19 1409      SLP LONG TERM GOAL #1   Title  pt will maintain 100% intelligibility over 6 total sessions    Baseline  10-03-19, 10-08-19    Time  5    Period  Weeks   or 13 visits   Status  On-going      SLP LONG TERM GOAL #2   Title  pt will demo HEP for dysarthria with rare min A in 3 sessions    Time  5  Period  Weeks    Status  On-going       Plan - 10/08/19 1406    Clinical Impression Statement  Pt continues today with significant rt-sided facial droop including minimal/no movement of facial/labial musculature on rt side, following a "leak"/bleed at the side of cavernous angioma in rt lateral pons/middle cerebellar peduncle, since September 2020, resulting in slurred speech and oral dysphagia. Pt states she is skeptical she will regain function of rt side of her face, and SLP has told pt that if there is a portion of her flaccidity due to weakness she should not expect any change for at LEAST four weeks. Pt tells SLP her MD states she will regain some muscle use over time. Pt was modified independnent with HEP, and agrees with SLP to reduce to x1/week.  Pt would cont to benefit from skilled ST focusing on muscle strengthening for lips and facial musculature on rt.    Speech Therapy Frequency  1x /week    Duration  --   6 weeks or 13 total visits   Treatment/Interventions  Oral motor exercises;Functional tasks;Cueing hierarchy;SLP instruction and feedback;Patient/family education;Compensatory strategies;Internal/external aids    Potential to Achieve  Goals  Fair    Potential Considerations  Previous level of function    SLP Home Exercise Plan  provided today    Consulted and Agree with Plan of Care  Patient       Patient will benefit from skilled therapeutic intervention in order to improve the following deficits and impairments:   Dysarthria and anarthria  Dysphagia, oral phase    Problem List Patient Active Problem List   Diagnosis Date Noted  . Female stress incontinence 07/01/2018  . Menopausal symptom 07/01/2018  . Reduced libido 07/01/2018  . Corneal irritation of right eye 05/08/2018  . Paralytic lagophthalmos of right upper eyelid 03/27/2018  . Cavernoma 03/22/2018  . Facial paralysis 01/30/2018  . Sensorineural hearing loss (SNHL) of right ear with restricted hearing of left ear 01/30/2018  . History of breast reconstruction 01/09/2018  . Oral herpes simplex infection 06/21/2017  . Breast neoplasm, Tis (DCIS), left 04/16/2017  . Abnormal mammogram of left breast 03/13/2017  . Exposure keratopathy, right 10/28/2013    [facility] ,MS, CCC-SLP  10/08/2019, 2:11 PM  [facility] [address] [place], [place], [address] Phone: [phone]   Fax:  [phone]   Name: Erica Chambers MRN: [id number] Date of Birth: 05-10-1956

## 2019-10-10 ENCOUNTER — Ambulatory Visit: Payer: BLUE CROSS/BLUE SHIELD

## 2019-10-17 ENCOUNTER — Ambulatory Visit: Payer: BLUE CROSS/BLUE SHIELD

## 2019-10-17 ENCOUNTER — Other Ambulatory Visit: Payer: Self-pay

## 2019-10-17 DIAGNOSIS — R1311 Dysphagia, oral phase: Secondary | ICD-10-CM

## 2019-10-17 DIAGNOSIS — R471 Dysarthria and anarthria: Secondary | ICD-10-CM | POA: Diagnosis not present

## 2019-10-17 NOTE — Therapy (Signed)
[facility] [address] [place], [place], [address] Phone: [phone]   Fax:  [phone]  Speech Language Pathology Treatment  Patient Details  Name: Erica Chambers MRN: [id number] Date of Birth: [birth year] Referring Provider (SLP): Morrison Old, MD   Encounter Date: 10/17/2019  End of Session - 10/17/19 1235    Visit Number  4    Number of Visits  13    Date for SLP Re-Evaluation  12/09/19    SLP Start Time  [id number]    SLP Stop Time   [id number]    SLP Time Calculation (min)  32 min    Activity Tolerance  Patient tolerated treatment well       History reviewed. No pertinent past medical history.  Past Surgical History:  Procedure Laterality Date  . AUGMENTATION MAMMAPLASTY Bilateral     There were no vitals filed for this visit.  Subjective Assessment - 10/17/19 1154    Subjective  "I don't think I have seen any (changes)."    Currently in Pain?  No/denies            ADULT SLP TREATMENT - 10/17/19 1156      General Information   Behavior/Cognition  Alert;Cooperative;Pleasant mood      Treatment Provided   Treatment provided  Cognitive-Linquistic      Cognitive-Linquistic Treatment   Treatment focused on  Dysarthria    Skilled Treatment  SLP re-educated pt on timeframe from which she should expect incr'd strength. Encouraged her to add 5 reps to each exercise if she does not feel fatigue from the exercises. Pt performed HEP with modified independent today; She agreed it would be best to reduce frequency to every other week.       Assessment / Recommendations / Plan   Plan  --   reduce frequency to every other week     Progression Toward Goals   Progression toward goals  Progressing toward goals       SLP Education - 10/17/19 1238    Education Details  add 5 reps to each exercise if no fatigue felt    Person(s) Educated  Patient    Methods  Explanation    Comprehension  Verbalized  understanding       SLP Short Term Goals - 10/17/19 1239      SLP SHORT TERM GOAL #1   Title  pt will perform HEP with rare min A for 2 sessions    Baseline  10-08-19    Status  Achieved      SLP SHORT TERM GOAL #2   Title  pt will maintain 100% intelligibility over 3 sessions    Baseline  10-03-19, 10-08-19    Status  Achieved       SLP Long Term Goals - 10/17/19 1239      SLP LONG TERM GOAL #1   Title  pt will maintain 100% intelligibility over 6 total sessions    Baseline  10-03-19, 10-08-19, 10-17-19    Time  4    Period  Weeks   or 13 visits   Status  On-going      SLP LONG TERM GOAL #2   Title  pt will demo HEP for dysarthria with rare min A in 3 sessions    Baseline  10-08-19, 10-17-19    Time  4    Period  Weeks    Status  On-going       Plan - 10/17/19 1236  Clinical Impression Statement  Pt continues today with significant rt-sided facial droop including minimal/no movement of facial/labial musculature on rt side, following a "leak"/bleed at the side of cavernous angioma in rt lateral pons/middle cerebellar peduncle, since September 2020, resulting in slurred speech and oral dysphagia. Pt remains skeptical she will regain function of rt side of her face, and SLP has told pt that if there is a portion of her flaccidity due to weakness she should regain some function. Pt tells SLP her MD states she will regain some muscle use over time. Pt remained modified independnent with HEP, and agrees with SLP to reduce to x1/ every other week.  Pt would cont to benefit from skilled ST focusing on muscle strengthening for lips and facial musculature on rt.    Speech Therapy Frequency  --   every other week   Duration  --   6 weeks or 13 total visits   Treatment/Interventions  Oral motor exercises;Functional tasks;Cueing hierarchy;SLP instruction and feedback;Patient/family education;Compensatory strategies;Internal/external aids    Potential to Achieve Goals  Fair    Potential  Considerations  Previous level of function    SLP Home Exercise Plan  provided today    Consulted and Agree with Plan of Care  Patient       Patient will benefit from skilled therapeutic intervention in order to improve the following deficits and impairments:   Dysarthria and anarthria  Dysphagia, oral phase    Problem List Patient Active Problem List   Diagnosis Date Noted  . Female stress incontinence 07/01/2018  . Menopausal symptom 07/01/2018  . Reduced libido 07/01/2018  . Corneal irritation of right eye 05/08/2018  . Paralytic lagophthalmos of right upper eyelid 03/27/2018  . Cavernoma 03/22/2018  . Facial paralysis 01/30/2018  . Sensorineural hearing loss (SNHL) of right ear with restricted hearing of left ear 01/30/2018  . History of breast reconstruction 01/09/2018  . Oral herpes simplex infection 06/21/2017  . Breast neoplasm, Tis (DCIS), left 04/16/2017  . Abnormal mammogram of left breast 03/13/2017  . Exposure keratopathy, right 10/28/2013    [facility] ,MS, CCC-SLP  10/17/2019, 12:40 PM  [facility] [address] [place], [place], [address] Phone: [phone]   Fax:  [phone]   Name: Erica Chambers MRN: [id number] Date of Birth: March 24, 1956

## 2019-11-07 ENCOUNTER — Ambulatory Visit: Payer: BLUE CROSS/BLUE SHIELD | Attending: Radiology

## 2019-11-07 ENCOUNTER — Other Ambulatory Visit: Payer: Self-pay

## 2019-11-07 DIAGNOSIS — R1311 Dysphagia, oral phase: Secondary | ICD-10-CM | POA: Diagnosis present

## 2019-11-07 DIAGNOSIS — R471 Dysarthria and anarthria: Secondary | ICD-10-CM | POA: Diagnosis present

## 2019-11-07 NOTE — Therapy (Signed)
[facility] [address] [place], [place], [address] Phone: [phone]   Fax:  [phone]  Speech Language Pathology Treatment  Patient Details  Name: Erica Chambers MRN: [id number] Date of Birth: [birth year] Referring Provider (SLP): Morrison Old, MD   Encounter Date: 11/07/2019   End of Session - 11/07/19 1656    Visit Number 5    Number of Visits 13    Date for SLP Re-Evaluation 12/09/19    SLP Start Time 4854    SLP Stop Time  1440    SLP Time Calculation (min) 37 min    Activity Tolerance Patient tolerated treatment well           No past medical history on file.  Past Surgical History:  Procedure Laterality Date  . AUGMENTATION MAMMAPLASTY Bilateral     There were no vitals filed for this visit.   Subjective Assessment - 11/07/19 1653    Subjective "Maybe there's a little movement - I don't know it's hard for me to tell."    Currently in Pain? No/denies                 ADULT SLP TREATMENT - 11/07/19 1653      General Information   Behavior/Cognition Alert;Cooperative;Pleasant mood      Treatment Provided   Treatment provided Cognitive-Linquistic      Cognitive-Linquistic Treatment   Treatment focused on Dysarthria    Skilled Treatment Pt stated she has added reps to each exercise and reports still feeling fatigue. Pt performed HEP with modified independent today. SLP assessed muscle movement with each exercise - possibly more movement with labial retraction and pucker (?). If procedure stays the same next visit (11-25-19) pt will be discharged as we should expect all strength-based changes by that time (9 weeks of completing exercises).       Assessment / Recommendations / Plan   Plan Other (Comment)   likely d/c next session     Progression Toward Goals   Progression toward goals Progressing toward goals              SLP Short Term Goals - 10/17/19 1239      SLP SHORT TERM  GOAL #1   Title pt will perform HEP with rare min A for 2 sessions    Baseline 10-08-19    Status Achieved      SLP SHORT TERM GOAL #2   Title pt will maintain 100% intelligibility over 3 sessions    Baseline 10-03-19, 10-08-19    Status Achieved            SLP Long Term Goals - 11/07/19 1658      SLP LONG TERM GOAL #1   Title pt will maintain 100% intelligibility over 6 total sessions    Baseline 10-03-19, 10-08-19, 10-17-19, 11-07-19    Time 3    Period Weeks   or 13 visits   Status On-going      SLP LONG TERM GOAL #2   Title pt will demo HEP for dysarthria with rare min A in 3 sessions    Baseline 10-08-19, 10-17-19, 11-07-19    Status Achieved            Plan - 11/07/19 1657    Clinical Impression Statement Pt continues today with significant rt-sided facial droop including minimal/no movement of facial/labial musculature on rt side, following a "leak"/bleed at the side of cavernous angioma in rt lateral pons/middle cerebellar peduncle, since September  2020, resulting in slurred speech and oral dysphagia. Pt remains skeptical she will regain function of rt side of her face, and SLP has told pt that if there is a portion of her flaccidity due to weakness she should regain some function. Pt tells SLP her MD states she will regain some muscle use over time. Pt remained modified independnent with HEP.  Pt would cont to benefit from skilled ST, likely for one more session, focusing on muscle strengthening for lips and facial musculature on rt.    Speech Therapy Frequency --   every other week   Duration --   6 weeks or 13 total visits   Treatment/Interventions Oral motor exercises;Functional tasks;Cueing hierarchy;SLP instruction and feedback;Patient/family education;Compensatory strategies;Internal/external aids    Potential to Achieve Goals Fair    Potential Considerations Previous level of function    SLP Home Exercise Plan provided today    Consulted and Agree with Plan of Care Patient            Patient will benefit from skilled therapeutic intervention in order to improve the following deficits and impairments:   Dysarthria and anarthria    Problem List Patient Active Problem List   Diagnosis Date Noted  . Female stress incontinence 07/01/2018  . Menopausal symptom 07/01/2018  . Reduced libido 07/01/2018  . Corneal irritation of right eye 05/08/2018  . Paralytic lagophthalmos of right upper eyelid 03/27/2018  . Cavernoma 03/22/2018  . Facial paralysis 01/30/2018  . Sensorineural hearing loss (SNHL) of right ear with restricted hearing of left ear 01/30/2018  . History of breast reconstruction 01/09/2018  . Oral herpes simplex infection 06/21/2017  . Breast neoplasm, Tis (DCIS), left 04/16/2017  . Abnormal mammogram of left breast 03/13/2017  . Exposure keratopathy, right 10/28/2013    [facility] ,MS, CCC-SLP  11/07/2019, 4:59 PM  [facility] [address] [place], [place], [address] Phone: [phone]   Fax:  [phone]   Name: Erica Chambers MRN: [id number] Date of Birth: 1956/04/28

## 2019-11-12 ENCOUNTER — Ambulatory Visit: Payer: BLUE CROSS/BLUE SHIELD

## 2019-11-19 ENCOUNTER — Encounter: Payer: BLUE CROSS/BLUE SHIELD | Admitting: Speech Pathology

## 2019-11-26 ENCOUNTER — Ambulatory Visit: Payer: BLUE CROSS/BLUE SHIELD

## 2019-11-26 ENCOUNTER — Other Ambulatory Visit: Payer: Self-pay

## 2019-11-26 DIAGNOSIS — R1311 Dysphagia, oral phase: Secondary | ICD-10-CM

## 2019-11-26 DIAGNOSIS — R471 Dysarthria and anarthria: Secondary | ICD-10-CM | POA: Diagnosis not present

## 2019-11-26 NOTE — Therapy (Signed)
[facility] [address] [place], [place], [address] Phone: [phone]   Fax:  [phone]  Speech Language Pathology Treatment  Patient Details  Name: Erica Chambers MRN: [id number] Date of Birth: [birth year] Referring Provider (SLP): Morrison Old, MD   Encounter Date: 11/26/2019   End of Session - 11/26/19 1730    Visit Number 6    Number of Visits 13    Date for SLP Re-Evaluation 12/09/19    SLP Start Time 1117    SLP Stop Time  1427    SLP Time Calculation (min) 25 min    Activity Tolerance Patient tolerated treatment well           History reviewed. No pertinent past medical history.  Past Surgical History:  Procedure Laterality Date  . AUGMENTATION MAMMAPLASTY Bilateral     There were no vitals filed for this visit.   Subjective Assessment - 11/26/19 1415    Subjective "Still nothing, really." (re: movement on rt side with oral/buccal musculature)                ADULT SLP TREATMENT - 11/26/19 1722      General Information   Behavior/Cognition Alert;Cooperative;Pleasant mood      Treatment Provided   Treatment provided Cognitive-Linquistic      Cognitive-Linquistic Treatment   Treatment focused on Dysarthria;Patient/family/caregiver education    Skilled Treatment Pt denies any improvement in speech clarity - in fact tells SLP she thinks she has a "new lisp-thing" occurring with her speech that was not present during last session. Pt performed all HEP exercises with excellent procedure, however with limited/no rt-sided labial/buccal movement. As pt has completed HEP for ~9 weeks consistently, SLP believes SLP has done all he can offer at this time. SLP suggested, should pt desire, a second opinion at [facility] speech path.  At this time pt is undecided but will ask her MD at next appointment. Pt was reluctant to drive out of town for therapy but SLP encouraged pt to at least undergo a SLE.       Assessment / Recommendations / Plan   Plan Discharge SLP treatment due to (comment)   pt reached max rehab potential at this time     Progression Toward Goals   Progression toward goals --   see goals - d/c day             SLP Short Term Goals - 10/17/19 1239      SLP SHORT TERM GOAL #1   Title pt will perform HEP with rare min A for 2 sessions    Baseline 10-08-19    Status Achieved      SLP SHORT TERM GOAL #2   Title pt will maintain 100% intelligibility over 3 sessions    Baseline 10-03-19, 10-08-19    Status Achieved            SLP Long Term Goals - 11/26/19 1733      SLP LONG TERM GOAL #1   Title pt will maintain 100% intelligibility over 6 total sessions    Baseline 10-03-19, 10-08-19, 10-17-19, 11-07-19, 11-26-19    Period --   or 13 visits   Status Partially Met      SLP LONG TERM GOAL #2   Title pt will demo HEP for dysarthria with rare min A in 3 sessions    Baseline 10-08-19, 10-17-19, 11-07-19    Status Achieved  Plan - 11/26/19 1730    Clinical Impression Statement Pt continues today with significant rt-sided facial droop including minimal/no movement of facial/labial musculature on rt side, following a "leak"/bleed at the side of cavernous angioma in rt lateral pons/middle cerebellar peduncle, since September 2020, resulting in slurred speech, and oral dysphagia (now almost resolved). Pt continues skeptical she will regain function of rt side of her face, and SLP believes her flaccidity is not due to muscle weakness, as she has regained minimal/no function since initiating HEP almost 9 weeks ago. Today, pt was independnent with HEP.  Pt agrees with d/c today.    Speech Therapy Frequency --   every other week   Duration --   6 weeks or 13 total visits   Treatment/Interventions Oral motor exercises;Functional tasks;Cueing hierarchy;SLP instruction and feedback;Patient/family education;Compensatory strategies;Internal/external aids    Potential to Achieve  Goals Fair    Potential Considerations Previous level of function    SLP Home Exercise Plan provided today    Consulted and Agree with Plan of Care Patient           Patient will benefit from skilled therapeutic intervention in order to improve the following deficits and impairments:   Dysarthria and anarthria  Dysphagia, oral phase   SPEECH THERAPY DISCHARGE SUMMARY  Visits from Start of Care: 6  Current functional level related to goals / functional outcomes: See goal section above. Pt with good success with goals but minimal/no functional change in muscle response in oral verbal and verbal tasks. SLP suggested a second opinion at [facility] speech pathology, if pt desires.   Remaining deficits: Significant rt-sided oral motor (labial/buccal) weakness.   Education / Equipment: HEP procedure   Plan: Patient agrees to discharge.  Patient goals were partially met. Patient is being discharged due to                                                     ?????meeting current rehab potential.       Problem List Patient Active Problem List   Diagnosis Date Noted  . Female stress incontinence 07/01/2018  . Menopausal symptom 07/01/2018  . Reduced libido 07/01/2018  . Corneal irritation of right eye 05/08/2018  . Paralytic lagophthalmos of right upper eyelid 03/27/2018  . Cavernoma 03/22/2018  . Facial paralysis 01/30/2018  . Sensorineural hearing loss (SNHL) of right ear with restricted hearing of left ear 01/30/2018  . History of breast reconstruction 01/09/2018  . Oral herpes simplex infection 06/21/2017  . Breast neoplasm, Tis (DCIS), left 04/16/2017  . Abnormal mammogram of left breast 03/13/2017  . Exposure keratopathy, right 10/28/2013    [facility] ,Isabel, CCC-SLP  11/26/2019, 5:33 PM  [facility] [address] [place], [place], [address] Phone: [phone]   Fax:  [phone]   Name: Erica Chambers  MRN: [id number] Date of Birth: 01-29-1956

## 2019-11-26 NOTE — Patient Instructions (Signed)
Continue to do the exercises as you see fit.

## 2022-01-02 DIAGNOSIS — N6322 Unspecified lump in the left breast, upper inner quadrant: Secondary | ICD-10-CM | POA: Diagnosis not present

## 2022-01-02 DIAGNOSIS — D242 Benign neoplasm of left breast: Secondary | ICD-10-CM | POA: Diagnosis not present

## 2022-01-02 DIAGNOSIS — N641 Fat necrosis of breast: Secondary | ICD-10-CM | POA: Diagnosis not present

## 2022-01-06 DIAGNOSIS — H02231 Paralytic lagophthalmos right upper eyelid: Secondary | ICD-10-CM | POA: Diagnosis not present

## 2022-01-06 DIAGNOSIS — H16211 Exposure keratoconjunctivitis, right eye: Secondary | ICD-10-CM | POA: Diagnosis not present

## 2022-01-06 DIAGNOSIS — H5789 Other specified disorders of eye and adnexa: Secondary | ICD-10-CM | POA: Diagnosis not present

## 2022-01-11 DIAGNOSIS — N6322 Unspecified lump in the left breast, upper inner quadrant: Secondary | ICD-10-CM | POA: Diagnosis not present

## 2022-01-31 DIAGNOSIS — M810 Age-related osteoporosis without current pathological fracture: Secondary | ICD-10-CM | POA: Diagnosis not present

## 2022-01-31 DIAGNOSIS — C50612 Malignant neoplasm of axillary tail of left female breast: Secondary | ICD-10-CM | POA: Diagnosis not present

## 2022-01-31 DIAGNOSIS — Z170 Estrogen receptor positive status [ER+]: Secondary | ICD-10-CM | POA: Diagnosis not present

## 2022-02-08 DIAGNOSIS — M8580 Other specified disorders of bone density and structure, unspecified site: Secondary | ICD-10-CM | POA: Diagnosis not present

## 2022-02-08 DIAGNOSIS — R7989 Other specified abnormal findings of blood chemistry: Secondary | ICD-10-CM | POA: Diagnosis not present

## 2022-02-08 DIAGNOSIS — Z79899 Other long term (current) drug therapy: Secondary | ICD-10-CM | POA: Diagnosis not present

## 2022-04-03 DIAGNOSIS — D225 Melanocytic nevi of trunk: Secondary | ICD-10-CM | POA: Diagnosis not present

## 2022-04-03 DIAGNOSIS — C44519 Basal cell carcinoma of skin of other part of trunk: Secondary | ICD-10-CM | POA: Diagnosis not present

## 2022-04-03 DIAGNOSIS — L720 Epidermal cyst: Secondary | ICD-10-CM | POA: Diagnosis not present

## 2022-04-03 DIAGNOSIS — L821 Other seborrheic keratosis: Secondary | ICD-10-CM | POA: Diagnosis not present

## 2022-04-03 DIAGNOSIS — L814 Other melanin hyperpigmentation: Secondary | ICD-10-CM | POA: Diagnosis not present

## 2022-04-03 DIAGNOSIS — D2272 Melanocytic nevi of left lower limb, including hip: Secondary | ICD-10-CM | POA: Diagnosis not present

## 2022-04-03 DIAGNOSIS — Z85828 Personal history of other malignant neoplasm of skin: Secondary | ICD-10-CM | POA: Diagnosis not present

## 2022-04-03 DIAGNOSIS — Z85820 Personal history of malignant melanoma of skin: Secondary | ICD-10-CM | POA: Diagnosis not present

## 2022-04-03 DIAGNOSIS — L570 Actinic keratosis: Secondary | ICD-10-CM | POA: Diagnosis not present

## 2022-04-03 DIAGNOSIS — D2271 Melanocytic nevi of right lower limb, including hip: Secondary | ICD-10-CM | POA: Diagnosis not present

## 2022-04-03 DIAGNOSIS — D485 Neoplasm of uncertain behavior of skin: Secondary | ICD-10-CM | POA: Diagnosis not present

## 2022-04-05 DIAGNOSIS — G510 Bell's palsy: Secondary | ICD-10-CM | POA: Diagnosis not present

## 2022-04-05 DIAGNOSIS — Z9889 Other specified postprocedural states: Secondary | ICD-10-CM | POA: Diagnosis not present

## 2022-04-05 DIAGNOSIS — R928 Other abnormal and inconclusive findings on diagnostic imaging of breast: Secondary | ICD-10-CM | POA: Diagnosis not present

## 2022-04-05 DIAGNOSIS — Z9012 Acquired absence of left breast and nipple: Secondary | ICD-10-CM | POA: Diagnosis not present

## 2022-04-05 DIAGNOSIS — D0512 Intraductal carcinoma in situ of left breast: Secondary | ICD-10-CM | POA: Diagnosis not present

## 2022-06-01 DIAGNOSIS — N6322 Unspecified lump in the left breast, upper inner quadrant: Secondary | ICD-10-CM | POA: Diagnosis not present

## 2022-06-01 DIAGNOSIS — Z853 Personal history of malignant neoplasm of breast: Secondary | ICD-10-CM | POA: Diagnosis not present

## 2022-06-01 DIAGNOSIS — Z08 Encounter for follow-up examination after completed treatment for malignant neoplasm: Secondary | ICD-10-CM | POA: Diagnosis not present

## 2022-06-06 DIAGNOSIS — Z170 Estrogen receptor positive status [ER+]: Secondary | ICD-10-CM | POA: Diagnosis not present

## 2022-06-06 DIAGNOSIS — C50612 Malignant neoplasm of axillary tail of left female breast: Secondary | ICD-10-CM | POA: Diagnosis not present

## 2022-06-06 DIAGNOSIS — M810 Age-related osteoporosis without current pathological fracture: Secondary | ICD-10-CM | POA: Diagnosis not present

## 2022-06-21 DIAGNOSIS — H16211 Exposure keratoconjunctivitis, right eye: Secondary | ICD-10-CM | POA: Diagnosis not present

## 2022-06-21 DIAGNOSIS — H02231 Paralytic lagophthalmos right upper eyelid: Secondary | ICD-10-CM | POA: Diagnosis not present

## 2022-08-02 DIAGNOSIS — Z01419 Encounter for gynecological examination (general) (routine) without abnormal findings: Secondary | ICD-10-CM | POA: Diagnosis not present

## 2022-08-02 DIAGNOSIS — N952 Postmenopausal atrophic vaginitis: Secondary | ICD-10-CM | POA: Diagnosis not present

## 2022-08-02 DIAGNOSIS — B002 Herpesviral gingivostomatitis and pharyngotonsillitis: Secondary | ICD-10-CM | POA: Diagnosis not present

## 2022-08-02 DIAGNOSIS — Z681 Body mass index (BMI) 19 or less, adult: Secondary | ICD-10-CM | POA: Diagnosis not present

## 2022-08-02 DIAGNOSIS — Z1389 Encounter for screening for other disorder: Secondary | ICD-10-CM | POA: Diagnosis not present

## 2022-08-02 DIAGNOSIS — Z130 Encounter for screening for diseases of the blood and blood-forming organs and certain disorders involving the immune mechanism: Secondary | ICD-10-CM | POA: Diagnosis not present

## 2022-09-05 DIAGNOSIS — C50612 Malignant neoplasm of axillary tail of left female breast: Secondary | ICD-10-CM | POA: Diagnosis not present

## 2022-09-05 DIAGNOSIS — Z170 Estrogen receptor positive status [ER+]: Secondary | ICD-10-CM | POA: Diagnosis not present

## 2022-09-05 DIAGNOSIS — R5383 Other fatigue: Secondary | ICD-10-CM | POA: Diagnosis not present

## 2022-09-05 DIAGNOSIS — D0512 Intraductal carcinoma in situ of left breast: Secondary | ICD-10-CM | POA: Diagnosis not present

## 2022-10-02 DIAGNOSIS — R051 Acute cough: Secondary | ICD-10-CM | POA: Diagnosis not present

## 2022-10-02 DIAGNOSIS — J069 Acute upper respiratory infection, unspecified: Secondary | ICD-10-CM | POA: Diagnosis not present

## 2022-11-23 DIAGNOSIS — Z79811 Long term (current) use of aromatase inhibitors: Secondary | ICD-10-CM | POA: Diagnosis not present

## 2022-11-23 DIAGNOSIS — G63 Polyneuropathy in diseases classified elsewhere: Secondary | ICD-10-CM | POA: Diagnosis not present

## 2022-11-23 DIAGNOSIS — Z853 Personal history of malignant neoplasm of breast: Secondary | ICD-10-CM | POA: Diagnosis not present

## 2022-11-23 DIAGNOSIS — M1990 Unspecified osteoarthritis, unspecified site: Secondary | ICD-10-CM | POA: Diagnosis not present

## 2022-11-23 DIAGNOSIS — Z823 Family history of stroke: Secondary | ICD-10-CM | POA: Diagnosis not present

## 2022-11-23 DIAGNOSIS — Z87891 Personal history of nicotine dependence: Secondary | ICD-10-CM | POA: Diagnosis not present

## 2022-11-23 DIAGNOSIS — Z87440 Personal history of urinary (tract) infections: Secondary | ICD-10-CM | POA: Diagnosis not present

## 2022-11-23 DIAGNOSIS — Z8249 Family history of ischemic heart disease and other diseases of the circulatory system: Secondary | ICD-10-CM | POA: Diagnosis not present

## 2022-11-23 DIAGNOSIS — R32 Unspecified urinary incontinence: Secondary | ICD-10-CM | POA: Diagnosis not present

## 2022-11-23 DIAGNOSIS — R030 Elevated blood-pressure reading, without diagnosis of hypertension: Secondary | ICD-10-CM | POA: Diagnosis not present

## 2022-11-23 DIAGNOSIS — C50919 Malignant neoplasm of unspecified site of unspecified female breast: Secondary | ICD-10-CM | POA: Diagnosis not present

## 2022-11-23 DIAGNOSIS — M810 Age-related osteoporosis without current pathological fracture: Secondary | ICD-10-CM | POA: Diagnosis not present

## 2022-12-13 DIAGNOSIS — C44719 Basal cell carcinoma of skin of left lower limb, including hip: Secondary | ICD-10-CM | POA: Diagnosis not present

## 2022-12-13 DIAGNOSIS — D485 Neoplasm of uncertain behavior of skin: Secondary | ICD-10-CM | POA: Diagnosis not present

## 2022-12-13 DIAGNOSIS — D225 Melanocytic nevi of trunk: Secondary | ICD-10-CM | POA: Diagnosis not present

## 2022-12-13 DIAGNOSIS — L821 Other seborrheic keratosis: Secondary | ICD-10-CM | POA: Diagnosis not present

## 2022-12-13 DIAGNOSIS — L905 Scar conditions and fibrosis of skin: Secondary | ICD-10-CM | POA: Diagnosis not present

## 2022-12-13 DIAGNOSIS — C44712 Basal cell carcinoma of skin of right lower limb, including hip: Secondary | ICD-10-CM | POA: Diagnosis not present

## 2022-12-13 DIAGNOSIS — C44319 Basal cell carcinoma of skin of other parts of face: Secondary | ICD-10-CM | POA: Diagnosis not present

## 2022-12-13 DIAGNOSIS — L814 Other melanin hyperpigmentation: Secondary | ICD-10-CM | POA: Diagnosis not present

## 2022-12-13 DIAGNOSIS — Z85828 Personal history of other malignant neoplasm of skin: Secondary | ICD-10-CM | POA: Diagnosis not present

## 2022-12-13 DIAGNOSIS — Z85820 Personal history of malignant melanoma of skin: Secondary | ICD-10-CM | POA: Diagnosis not present

## 2022-12-13 DIAGNOSIS — L570 Actinic keratosis: Secondary | ICD-10-CM | POA: Diagnosis not present

## 2022-12-13 DIAGNOSIS — L910 Hypertrophic scar: Secondary | ICD-10-CM | POA: Diagnosis not present

## 2022-12-18 DIAGNOSIS — H52222 Regular astigmatism, left eye: Secondary | ICD-10-CM | POA: Diagnosis not present

## 2022-12-18 DIAGNOSIS — H5202 Hypermetropia, left eye: Secondary | ICD-10-CM | POA: Diagnosis not present

## 2023-01-02 DIAGNOSIS — C50919 Malignant neoplasm of unspecified site of unspecified female breast: Secondary | ICD-10-CM | POA: Diagnosis not present

## 2023-01-02 DIAGNOSIS — Z170 Estrogen receptor positive status [ER+]: Secondary | ICD-10-CM | POA: Diagnosis not present

## 2023-01-02 DIAGNOSIS — C50612 Malignant neoplasm of axillary tail of left female breast: Secondary | ICD-10-CM | POA: Diagnosis not present

## 2023-01-18 DIAGNOSIS — H02811 Retained foreign body in right upper eyelid: Secondary | ICD-10-CM | POA: Diagnosis not present

## 2023-01-18 DIAGNOSIS — H0279 Other degenerative disorders of eyelid and periocular area: Secondary | ICD-10-CM | POA: Diagnosis not present

## 2023-01-18 DIAGNOSIS — H02231 Paralytic lagophthalmos right upper eyelid: Secondary | ICD-10-CM | POA: Diagnosis not present

## 2023-01-23 DIAGNOSIS — R92323 Mammographic fibroglandular density, bilateral breasts: Secondary | ICD-10-CM | POA: Diagnosis not present

## 2023-01-23 DIAGNOSIS — Z9889 Other specified postprocedural states: Secondary | ICD-10-CM | POA: Diagnosis not present

## 2023-01-23 DIAGNOSIS — Z170 Estrogen receptor positive status [ER+]: Secondary | ICD-10-CM | POA: Diagnosis not present

## 2023-01-23 DIAGNOSIS — C50612 Malignant neoplasm of axillary tail of left female breast: Secondary | ICD-10-CM | POA: Diagnosis not present

## 2023-01-23 DIAGNOSIS — N6321 Unspecified lump in the left breast, upper outer quadrant: Secondary | ICD-10-CM | POA: Diagnosis not present

## 2023-01-23 DIAGNOSIS — R928 Other abnormal and inconclusive findings on diagnostic imaging of breast: Secondary | ICD-10-CM | POA: Diagnosis not present

## 2023-03-01 DIAGNOSIS — Z0000 Encounter for general adult medical examination without abnormal findings: Secondary | ICD-10-CM | POA: Diagnosis not present

## 2023-03-01 DIAGNOSIS — M545 Low back pain, unspecified: Secondary | ICD-10-CM | POA: Diagnosis not present

## 2023-03-01 DIAGNOSIS — M8580 Other specified disorders of bone density and structure, unspecified site: Secondary | ICD-10-CM | POA: Diagnosis not present

## 2023-03-01 DIAGNOSIS — Z23 Encounter for immunization: Secondary | ICD-10-CM | POA: Diagnosis not present

## 2023-03-01 DIAGNOSIS — R82998 Other abnormal findings in urine: Secondary | ICD-10-CM | POA: Diagnosis not present

## 2023-03-01 DIAGNOSIS — Z85828 Personal history of other malignant neoplasm of skin: Secondary | ICD-10-CM | POA: Diagnosis not present

## 2023-03-01 DIAGNOSIS — Z853 Personal history of malignant neoplasm of breast: Secondary | ICD-10-CM | POA: Diagnosis not present

## 2023-03-01 DIAGNOSIS — Z85820 Personal history of malignant melanoma of skin: Secondary | ICD-10-CM | POA: Diagnosis not present

## 2023-03-01 DIAGNOSIS — D1802 Hemangioma of intracranial structures: Secondary | ICD-10-CM | POA: Diagnosis not present

## 2023-03-08 DIAGNOSIS — Z780 Asymptomatic menopausal state: Secondary | ICD-10-CM | POA: Diagnosis not present

## 2023-03-08 DIAGNOSIS — M85852 Other specified disorders of bone density and structure, left thigh: Secondary | ICD-10-CM | POA: Diagnosis not present

## 2023-04-25 DIAGNOSIS — C50612 Malignant neoplasm of axillary tail of left female breast: Secondary | ICD-10-CM | POA: Diagnosis not present

## 2023-04-25 DIAGNOSIS — Z170 Estrogen receptor positive status [ER+]: Secondary | ICD-10-CM | POA: Diagnosis not present

## 2023-04-25 DIAGNOSIS — Z1231 Encounter for screening mammogram for malignant neoplasm of breast: Secondary | ICD-10-CM | POA: Diagnosis not present

## 2023-04-25 DIAGNOSIS — D0512 Intraductal carcinoma in situ of left breast: Secondary | ICD-10-CM | POA: Diagnosis not present

## 2023-05-01 DIAGNOSIS — R928 Other abnormal and inconclusive findings on diagnostic imaging of breast: Secondary | ICD-10-CM | POA: Diagnosis not present

## 2023-05-01 DIAGNOSIS — C50612 Malignant neoplasm of axillary tail of left female breast: Secondary | ICD-10-CM | POA: Diagnosis not present

## 2023-05-01 DIAGNOSIS — Z9889 Other specified postprocedural states: Secondary | ICD-10-CM | POA: Diagnosis not present

## 2023-05-01 DIAGNOSIS — D0512 Intraductal carcinoma in situ of left breast: Secondary | ICD-10-CM | POA: Diagnosis not present

## 2023-05-01 DIAGNOSIS — Z170 Estrogen receptor positive status [ER+]: Secondary | ICD-10-CM | POA: Diagnosis not present

## 2023-06-19 DIAGNOSIS — Z85828 Personal history of other malignant neoplasm of skin: Secondary | ICD-10-CM | POA: Diagnosis not present

## 2023-06-19 DIAGNOSIS — D485 Neoplasm of uncertain behavior of skin: Secondary | ICD-10-CM | POA: Diagnosis not present

## 2023-06-19 DIAGNOSIS — L905 Scar conditions and fibrosis of skin: Secondary | ICD-10-CM | POA: Diagnosis not present

## 2023-06-19 DIAGNOSIS — L821 Other seborrheic keratosis: Secondary | ICD-10-CM | POA: Diagnosis not present

## 2023-06-19 DIAGNOSIS — L570 Actinic keratosis: Secondary | ICD-10-CM | POA: Diagnosis not present

## 2023-06-19 DIAGNOSIS — D225 Melanocytic nevi of trunk: Secondary | ICD-10-CM | POA: Diagnosis not present

## 2023-06-19 DIAGNOSIS — C44729 Squamous cell carcinoma of skin of left lower limb, including hip: Secondary | ICD-10-CM | POA: Diagnosis not present

## 2023-06-19 DIAGNOSIS — Z85820 Personal history of malignant melanoma of skin: Secondary | ICD-10-CM | POA: Diagnosis not present

## 2023-08-07 DIAGNOSIS — B002 Herpesviral gingivostomatitis and pharyngotonsillitis: Secondary | ICD-10-CM | POA: Diagnosis not present

## 2023-08-07 DIAGNOSIS — Z01419 Encounter for gynecological examination (general) (routine) without abnormal findings: Secondary | ICD-10-CM | POA: Diagnosis not present

## 2023-10-30 DIAGNOSIS — C50612 Malignant neoplasm of axillary tail of left female breast: Secondary | ICD-10-CM | POA: Diagnosis not present

## 2023-10-30 DIAGNOSIS — Z170 Estrogen receptor positive status [ER+]: Secondary | ICD-10-CM | POA: Diagnosis not present

## 2023-12-19 DIAGNOSIS — L821 Other seborrheic keratosis: Secondary | ICD-10-CM | POA: Diagnosis not present

## 2023-12-19 DIAGNOSIS — Z85820 Personal history of malignant melanoma of skin: Secondary | ICD-10-CM | POA: Diagnosis not present

## 2023-12-19 DIAGNOSIS — Z85828 Personal history of other malignant neoplasm of skin: Secondary | ICD-10-CM | POA: Diagnosis not present

## 2023-12-19 DIAGNOSIS — D2272 Melanocytic nevi of left lower limb, including hip: Secondary | ICD-10-CM | POA: Diagnosis not present

## 2023-12-19 DIAGNOSIS — L570 Actinic keratosis: Secondary | ICD-10-CM | POA: Diagnosis not present

## 2023-12-19 DIAGNOSIS — D2271 Melanocytic nevi of right lower limb, including hip: Secondary | ICD-10-CM | POA: Diagnosis not present

## 2023-12-19 DIAGNOSIS — L905 Scar conditions and fibrosis of skin: Secondary | ICD-10-CM | POA: Diagnosis not present

## 2024-03-17 DIAGNOSIS — H52202 Unspecified astigmatism, left eye: Secondary | ICD-10-CM | POA: Diagnosis not present

## 2024-03-17 DIAGNOSIS — H02231 Paralytic lagophthalmos right upper eyelid: Secondary | ICD-10-CM | POA: Diagnosis not present

## 2024-03-17 DIAGNOSIS — H52201 Unspecified astigmatism, right eye: Secondary | ICD-10-CM | POA: Diagnosis not present

## 2024-03-17 DIAGNOSIS — H16211 Exposure keratoconjunctivitis, right eye: Secondary | ICD-10-CM | POA: Diagnosis not present

## 2024-03-17 DIAGNOSIS — H2513 Age-related nuclear cataract, bilateral: Secondary | ICD-10-CM | POA: Diagnosis not present

## 2024-03-17 DIAGNOSIS — H5211 Myopia, right eye: Secondary | ICD-10-CM | POA: Diagnosis not present

## 2024-03-17 DIAGNOSIS — H524 Presbyopia: Secondary | ICD-10-CM | POA: Diagnosis not present

## 2024-03-17 DIAGNOSIS — H5202 Hypermetropia, left eye: Secondary | ICD-10-CM | POA: Diagnosis not present

## 2024-04-03 DIAGNOSIS — Z0000 Encounter for general adult medical examination without abnormal findings: Secondary | ICD-10-CM | POA: Diagnosis not present

## 2024-04-03 DIAGNOSIS — R7989 Other specified abnormal findings of blood chemistry: Secondary | ICD-10-CM | POA: Diagnosis not present

## 2024-04-10 DIAGNOSIS — Z1339 Encounter for screening examination for other mental health and behavioral disorders: Secondary | ICD-10-CM | POA: Diagnosis not present

## 2024-04-10 DIAGNOSIS — Z1331 Encounter for screening for depression: Secondary | ICD-10-CM | POA: Diagnosis not present

## 2024-04-10 DIAGNOSIS — Z0000 Encounter for general adult medical examination without abnormal findings: Secondary | ICD-10-CM | POA: Diagnosis not present

## 2024-04-10 DIAGNOSIS — G622 Polyneuropathy due to other toxic agents: Secondary | ICD-10-CM | POA: Diagnosis not present

## 2024-04-10 DIAGNOSIS — M545 Low back pain, unspecified: Secondary | ICD-10-CM | POA: Diagnosis not present

## 2024-04-10 DIAGNOSIS — D1802 Hemangioma of intracranial structures: Secondary | ICD-10-CM | POA: Diagnosis not present

## 2024-04-10 DIAGNOSIS — M8580 Other specified disorders of bone density and structure, unspecified site: Secondary | ICD-10-CM | POA: Diagnosis not present

## 2024-04-10 DIAGNOSIS — Z853 Personal history of malignant neoplasm of breast: Secondary | ICD-10-CM | POA: Diagnosis not present

## 2024-04-10 DIAGNOSIS — F325 Major depressive disorder, single episode, in full remission: Secondary | ICD-10-CM | POA: Diagnosis not present

## 2024-04-10 DIAGNOSIS — R82998 Other abnormal findings in urine: Secondary | ICD-10-CM | POA: Diagnosis not present

## 2024-04-10 DIAGNOSIS — Z85820 Personal history of malignant melanoma of skin: Secondary | ICD-10-CM | POA: Diagnosis not present

## 2024-04-16 DIAGNOSIS — R051 Acute cough: Secondary | ICD-10-CM | POA: Diagnosis not present

## 2024-04-16 DIAGNOSIS — J069 Acute upper respiratory infection, unspecified: Secondary | ICD-10-CM | POA: Diagnosis not present

## 2024-04-30 DIAGNOSIS — Z170 Estrogen receptor positive status [ER+]: Secondary | ICD-10-CM | POA: Diagnosis not present

## 2024-04-30 DIAGNOSIS — C50612 Malignant neoplasm of axillary tail of left female breast: Secondary | ICD-10-CM | POA: Diagnosis not present

## 2024-04-30 DIAGNOSIS — Z9012 Acquired absence of left breast and nipple: Secondary | ICD-10-CM | POA: Diagnosis not present

## 2024-05-06 DIAGNOSIS — M8589 Other specified disorders of bone density and structure, multiple sites: Secondary | ICD-10-CM | POA: Diagnosis not present
